# Patient Record
Sex: Female | Born: 1975 | Race: Black or African American | Hispanic: No | Marital: Married | Smoking: Never smoker
Health system: Southern US, Community
[De-identification: ages and names within clinical notes are randomized; demographics above are authoritative.]

## PROBLEM LIST (undated history)

## (undated) DIAGNOSIS — B977 Papillomavirus as the cause of diseases classified elsewhere: Secondary | ICD-10-CM

## (undated) DIAGNOSIS — Z9889 Other specified postprocedural states: Secondary | ICD-10-CM

## (undated) DIAGNOSIS — B009 Herpesviral infection, unspecified: Secondary | ICD-10-CM

## (undated) DIAGNOSIS — R112 Nausea with vomiting, unspecified: Secondary | ICD-10-CM

---

## 2001-04-05 HISTORY — PX: ANKLE SURGERY: SHX546

## 2002-08-07 ENCOUNTER — Encounter: Admission: RE | Admit: 2002-08-07 | Discharge: 2002-08-07 | Payer: Self-pay | Admitting: Family Medicine

## 2002-08-07 ENCOUNTER — Encounter: Payer: Self-pay | Admitting: Family Medicine

## 2003-12-02 ENCOUNTER — Other Ambulatory Visit: Admission: RE | Admit: 2003-12-02 | Discharge: 2003-12-02 | Payer: Self-pay | Admitting: Obstetrics and Gynecology

## 2004-07-14 ENCOUNTER — Other Ambulatory Visit: Admission: RE | Admit: 2004-07-14 | Discharge: 2004-07-14 | Payer: Self-pay | Admitting: Obstetrics and Gynecology

## 2004-07-15 ENCOUNTER — Other Ambulatory Visit: Admission: RE | Admit: 2004-07-15 | Discharge: 2004-07-15 | Payer: Self-pay | Admitting: Obstetrics and Gynecology

## 2005-01-20 ENCOUNTER — Inpatient Hospital Stay (HOSPITAL_COMMUNITY): Admission: AD | Admit: 2005-01-20 | Discharge: 2005-01-23 | Payer: Self-pay | Admitting: Obstetrics and Gynecology

## 2005-09-07 ENCOUNTER — Other Ambulatory Visit: Admission: RE | Admit: 2005-09-07 | Discharge: 2005-09-07 | Payer: Self-pay | Admitting: Obstetrics and Gynecology

## 2006-09-12 ENCOUNTER — Other Ambulatory Visit: Admission: RE | Admit: 2006-09-12 | Discharge: 2006-09-12 | Payer: Self-pay | Admitting: Obstetrics and Gynecology

## 2007-10-03 ENCOUNTER — Other Ambulatory Visit: Admission: RE | Admit: 2007-10-03 | Discharge: 2007-10-03 | Payer: Self-pay | Admitting: Obstetrics and Gynecology

## 2008-11-19 ENCOUNTER — Other Ambulatory Visit: Admission: RE | Admit: 2008-11-19 | Discharge: 2008-11-19 | Payer: Self-pay | Admitting: Obstetrics and Gynecology

## 2009-12-02 ENCOUNTER — Other Ambulatory Visit: Admission: RE | Admit: 2009-12-02 | Discharge: 2009-12-02 | Payer: Self-pay | Admitting: Obstetrics and Gynecology

## 2010-03-13 LAB — GC/CHLAMYDIA PROBE AMP, GENITAL
Chlamydia: NEGATIVE
Chlamydia: NEGATIVE
Gonorrhea: NEGATIVE
Gonorrhea: NEGATIVE

## 2010-03-27 LAB — TYPE AND SCREEN: Antibody Screen: NEGATIVE

## 2010-03-27 LAB — RPR: RPR: NONREACTIVE

## 2010-03-27 LAB — GC/CHLAMYDIA PROBE AMP, GENITAL: Chlamydia: NEGATIVE

## 2010-04-05 NOTE — L&D Delivery Note (Signed)
Delivery Note At 12:15 PM a viable female was delivered via Vaginal, Spontaneous Delivery (Presentation: Left Occiput Anterior).  APGAR: 7, 9; weight 8 lb 5 oz (3771 g).   Placenta status: Intact, Spontaneous.  Cord: 3 vessels with the following complications: Short.    Anesthesia: Epidural  Episiotomy: None Lacerations: 2nd degree Suture Repair: 3.0 vicryl Est. Blood Loss (mL): 400  Mom to postpartum.  Baby to nursery-stable.  [name] J. 10/29/2010, 2:09 PM

## 2010-08-21 NOTE — Op Note (Signed)
[name], [name]               ACCOUNT NO.:  [id number]   MEDICAL RECORD NO.:  [id number]          PATIENT TYPE:  INP   LOCATION:  9111                          FACILITY:  [facility]   PHYSICIAN:  Charles A. Delcambre, MDDATE OF BIRTH:  [birth year]   DATE OF PROCEDURE:  01/22/2005  DATE OF DISCHARGE:                                 OPERATIVE REPORT   DELIVERY NOTE:  This patient was admitted on January 20, 2005, to undergo  induction secondary to husband being off this week and being 39 weeks.  He  is a Clinical cytogeneticist and being off from his team and able to be  in town for delivery, and she is admitted for delivery at 39 weeks.  She  underwent Cervidil induction and was changed to Cytotec induction, changed  after one Cytotec tablet 25 mcg to Pitocin and had spontaneous rupture of  membranes at 4-5 cm, progressed on with Pitocin low-dose regimen to complete  cervical dilation.  She became complete at 0140, had spontaneous vaginal  delivery at 0247.  Placenta followed at 0254.  She had a vigorous female,  Apgars 8 and 9, spontaneous vaginal delivery.  A second degree midline  laceration was repaired, repaired with local anesthetic and 2-0 Vicryl in  standard fashion.  The placenta was intact and was handed off to a tech to  undergo cord blood registry collection, and cord was cut by the father of  the baby.  Estimated blood loss was 400 mL.   COMPLICATIONS:  HSV.  No outbreak or evidence of lesion at the time of labor  and delivery.  Patient on Valtrex suppression.  Positive group B strep,  penicillin suppression given.   PROCEDURES:  1.  Cervidil, Cytotec and Pitocin induction.  2.  Spontaneous vaginal delivery.  3.  Manual rotation from occiput anterior to occiput posterior.  4.  Spontaneous vaginal delivery with repair of laceration.      Charles A. Sydnee Cabal, MD  Electronically Signed     CAD/MEDQ  D:  01/22/2005  T:  01/22/2005  Job:  [id number]

## 2010-08-21 NOTE — H&P (Signed)
NAMEHANSINI, [name]               ACCOUNT NO.:  [id number]   MEDICAL RECORD NO.:  [id number]           PATIENT TYPE:   LOCATION:                                 FACILITY:   PHYSICIAN:  Charles A. Delcambre, MD    DATE OF BIRTH:   DATE OF ADMISSION:  DATE OF DISCHARGE:                                HISTORY & PHYSICAL   HISTORY OF PRESENT ILLNESS:  This patient is to be admitted January 20, 2005  for social induction with reasoning that her husband is a Marketing executive and will be off this week. She will be 39 weeks 0 days on  October 18 and to be induced at 39 weeks 1 day while he is in town for  several days. Due date is January 27, 2005. 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 Otherwise, Pap smear was  ASCUS, negative high-risk HPV. Blood type O positive. Rubella immune. Group  B strep positive on October 2. She has only an allergy to ASPIRIN so we will  plan penicillin prophylaxis once she approaches active labor.   PAST MEDICAL HISTORY:  HSV.   SURGICAL HISTORY:  Ankle surgery.   MEDICATIONS:  Prenatal vitamins, Valtrex 500 mg a day.   ALLERGIES:  ASPIRIN, reaction not specified.   SOCIAL HISTORY:  No tobacco, ethanol, or drug use, or STD exposure other  than HSV. The patient is married in a monogamous relationship with her  partner.   FAMILY HISTORY:  Father mid 54s, hypertension. Mother mid 35s, good health.  Brother 30s, good health. Otherwise, no major medical issues.   REVIEW OF SYSTEMS:  No headaches, blurred vision, scotomata, right upper  quadrant pain, chest pain, fever, chills, nausea, vomiting, diarrhea,  constipation, rupture of membranes, or bleeding. She notes active fetal  movement.   PHYSICAL EXAMINATION:  GENERAL:  Alert and oriented x3.  VITAL SIGNS:  Blood pressure 120/70, weight 175.5 pounds, respirations 18,  pulse 90.  HEENT:  Grossly within normal limits.  NECK:  Supple without  thyromegaly or adenopathy.  LUNGS:  Clear bilaterally.  HEART:  Regular rate and rhythm without rub or gallop, a 2/6 systolic  ejection murmur left sternal border.  BREASTS:  No masses, tenderness, discharge, skin or nipple changes  bilaterally.  ABDOMEN:  Soft, flat, nontender. Abdomen gravid. Fundal height 37 cm.  Estimated fetal weight 3600 g.  PELVIC:  Cervix closed, posterior at 37 weeks when examination undertaken.  EXTREMITIES:  Nontender, minimal edema.   ASSESSMENT:  1.  Intrauterine pregnancy at 39 weeks 1 day. Plan induction, social,      secondary to husband being in town from professional football      employment.  2.  Herpes simplex virus. No lesions, no outbreaks by history recently and      during the pregnancy, and on Valtrex suppression.  3.  Positive group B streptococcus.   PLAN:  1.  Cervidil induction, high-dose Pitocin once favorable. Penicillin      prophylaxis. Questions were answered and will proceed  as outlined. The      patient understands the possibility of a serial induction.      Charles A. Sydnee Cabal, MD  Electronically Signed     CAD/MEDQ  D:  01/13/2005  T:  01/13/2005  Job:  [id number]

## 2010-10-27 ENCOUNTER — Encounter (HOSPITAL_COMMUNITY): Payer: Self-pay

## 2010-10-27 ENCOUNTER — Inpatient Hospital Stay (HOSPITAL_COMMUNITY)
Admission: RE | Admit: 2010-10-27 | Discharge: 2010-10-29 | DRG: 775 | Disposition: A | Discharge status: Home / Self Care | Payer: PRIVATE HEALTH INSURANCE | Source: Ambulatory Visit | Attending: Obstetrics and Gynecology | Admitting: Obstetrics and Gynecology

## 2010-10-27 DIAGNOSIS — Z3480 Encounter for supervision of other normal pregnancy, unspecified trimester: Secondary | ICD-10-CM

## 2010-10-27 DIAGNOSIS — O480 Post-term pregnancy: Principal | ICD-10-CM | POA: Diagnosis present

## 2010-10-27 HISTORY — DX: Herpesviral infection, unspecified: B00.9

## 2010-10-27 HISTORY — DX: Other specified postprocedural states: Z98.890

## 2010-10-27 HISTORY — DX: Papillomavirus as the cause of diseases classified elsewhere: B97.7

## 2010-10-27 HISTORY — DX: Nausea with vomiting, unspecified: R11.2

## 2010-10-27 LAB — CBC
Hemoglobin: 12.6 g/dL (ref 12.0–15.0)
MCHC: 33.5 g/dL (ref 30.0–36.0)
RDW: 14.1 % (ref 11.5–15.5)

## 2010-10-27 MED ORDER — OXYTOCIN 20 UNITS IN LACTATED RINGERS INFUSION - SIMPLE
125.0000 mL/h | Freq: Once | INTRAVENOUS | Status: AC
  Administered 2010-10-28: 999 mL/h via INTRAVENOUS
  Filled 2010-10-27: qty 1000

## 2010-10-27 MED ORDER — LACTATED RINGERS IV SOLN
INTRAVENOUS | Status: DC
  Administered 2010-10-27 – 2010-10-28 (×4): via INTRAVENOUS

## 2010-10-27 MED ORDER — FLEET ENEMA 7-19 GM/118ML RE ENEM: 1.0000 | ENEMA | RECTAL | Status: DC | PRN

## 2010-10-27 MED ORDER — LACTATED RINGERS IV SOLN
500.0000 mL | INTRAVENOUS | Status: DC | PRN
  Administered 2010-10-28: 200 mL via INTRAVENOUS

## 2010-10-27 MED ORDER — TERBUTALINE SULFATE 1 MG/ML IJ SOLN: 0.2500 mg | Freq: Once | INTRAMUSCULAR | Status: AC | PRN

## 2010-10-27 MED ORDER — CITRIC ACID-SODIUM CITRATE 334-500 MG/5ML PO SOLN: 30.0000 mL | ORAL | Status: DC | PRN

## 2010-10-27 MED ORDER — NALBUPHINE SYRINGE 5 MG/0.5 ML
5.0000 mg | INJECTION | INTRAMUSCULAR | Status: DC | PRN
  Administered 2010-10-28: 5 mg via INTRAVENOUS
  Filled 2010-10-27 (×3): qty 0.5

## 2010-10-27 MED ORDER — DINOPROSTONE 10 MG VA INST
10.0000 mg | VAGINAL_INSERT | Freq: Once | VAGINAL | Status: AC
  Administered 2010-10-27: 10 mg via VAGINAL
  Filled 2010-10-27: qty 1

## 2010-10-27 MED ORDER — ACETAMINOPHEN 325 MG PO TABS: 650.0000 mg | ORAL_TABLET | ORAL | Status: DC | PRN

## 2010-10-27 MED ORDER — IBUPROFEN 600 MG PO TABS
600.0000 mg | ORAL_TABLET | Freq: Four times a day (QID) | ORAL | Status: DC | PRN
  Filled 2010-10-27: qty 1

## 2010-10-27 MED ORDER — ONDANSETRON HCL 4 MG/2ML IJ SOLN
4.0000 mg | Freq: Four times a day (QID) | INTRAMUSCULAR | Status: DC | PRN
  Administered 2010-10-28: 4 mg via INTRAVENOUS
  Filled 2010-10-27: qty 2

## 2010-10-27 MED ORDER — OXYCODONE-ACETAMINOPHEN 5-325 MG PO TABS: 2.0000 | ORAL_TABLET | ORAL | Status: DC | PRN

## 2010-10-27 MED ORDER — LIDOCAINE HCL (PF) 1 % IJ SOLN
30.0000 mL | INTRAMUSCULAR | Status: DC | PRN
  Filled 2010-10-27 (×2): qty 30

## 2010-10-27 MED ORDER — ZOLPIDEM TARTRATE 10 MG PO TABS: 10.0000 mg | ORAL_TABLET | Freq: Every evening | ORAL | Status: DC | PRN

## 2010-10-27 NOTE — Plan of Care (Signed)
Problem: Consults Goal: Birthing Suites Patient Information Press F2 to bring up selections list Outcome: Completed/Met Date Met:  10/27/10  Pt > [redacted] weeks EGA

## 2010-10-28 ENCOUNTER — Encounter (HOSPITAL_COMMUNITY): Payer: Self-pay

## 2010-10-28 ENCOUNTER — Encounter (HOSPITAL_COMMUNITY): Payer: Self-pay | Admitting: Anesthesiology

## 2010-10-28 ENCOUNTER — Ambulatory Visit (HOSPITAL_COMMUNITY): Payer: PRIVATE HEALTH INSURANCE | Admitting: Anesthesiology

## 2010-10-28 DIAGNOSIS — Z3480 Encounter for supervision of other normal pregnancy, unspecified trimester: Secondary | ICD-10-CM

## 2010-10-28 LAB — RPR: RPR Ser Ql: NONREACTIVE

## 2010-10-28 MED ORDER — SIMETHICONE 80 MG PO CHEW: 80.0000 mg | CHEWABLE_TABLET | ORAL | Status: DC | PRN

## 2010-10-28 MED ORDER — LANOLIN HYDROUS EX OINT: TOPICAL_OINTMENT | CUTANEOUS | Status: DC | PRN

## 2010-10-28 MED ORDER — FENTANYL 2.5 MCG/ML BUPIVACAINE 1/10 % EPIDURAL INFUSION (WH - ANES)
14.0000 mL/h | INTRAMUSCULAR | Status: DC
  Administered 2010-10-28 (×2): 14 mL/h via EPIDURAL
  Filled 2010-10-28 (×2): qty 60

## 2010-10-28 MED ORDER — DIPHENHYDRAMINE HCL 25 MG PO CAPS: 25.0000 mg | ORAL_CAPSULE | Freq: Four times a day (QID) | ORAL | Status: DC | PRN

## 2010-10-28 MED ORDER — LACTATED RINGERS IV SOLN
500.0000 mL | Freq: Once | INTRAVENOUS | Status: AC
  Administered 2010-10-28: 1000 mL via INTRAVENOUS

## 2010-10-28 MED ORDER — ZOLPIDEM TARTRATE 5 MG PO TABS: 5.0000 mg | ORAL_TABLET | Freq: Every evening | ORAL | Status: DC | PRN

## 2010-10-28 MED ORDER — PRENATAL PLUS 27-1 MG PO TABS
1.0000 | ORAL_TABLET | Freq: Every day | ORAL | Status: DC
  Administered 2010-10-29: 1 via ORAL
  Filled 2010-10-28: qty 1

## 2010-10-28 MED ORDER — OXYCODONE-ACETAMINOPHEN 5-325 MG PO TABS: 1.0000 | ORAL_TABLET | ORAL | Status: DC | PRN

## 2010-10-28 MED ORDER — BENZOCAINE-MENTHOL 20-0.5 % EX AERO: 1.0000 "application " | INHALATION_SPRAY | CUTANEOUS | Status: DC | PRN

## 2010-10-28 MED ORDER — TETANUS-DIPHTH-ACELL PERTUSSIS 5-2.5-18.5 LF-MCG/0.5 IM SUSP
0.5000 mL | Freq: Once | INTRAMUSCULAR | Status: AC
  Administered 2010-10-29: 0.5 mL via INTRAMUSCULAR

## 2010-10-28 MED ORDER — ONDANSETRON HCL 4 MG PO TABS: 4.0000 mg | ORAL_TABLET | ORAL | Status: DC | PRN

## 2010-10-28 MED ORDER — DIPHENHYDRAMINE HCL 50 MG/ML IJ SOLN: 12.5000 mg | INTRAMUSCULAR | Status: DC | PRN

## 2010-10-28 MED ORDER — IBUPROFEN 600 MG PO TABS
600.0000 mg | ORAL_TABLET | Freq: Four times a day (QID) | ORAL | Status: DC
  Administered 2010-10-28 – 2010-10-29 (×3): 600 mg via ORAL
  Filled 2010-10-28 (×3): qty 1

## 2010-10-28 MED ORDER — PHENYLEPHRINE 40 MCG/ML (10ML) SYRINGE FOR IV PUSH (FOR BLOOD PRESSURE SUPPORT)
80.0000 ug | PREFILLED_SYRINGE | INTRAVENOUS | Status: DC | PRN
  Filled 2010-10-28: qty 5

## 2010-10-28 MED ORDER — SENNOSIDES-DOCUSATE SODIUM 8.6-50 MG PO TABS
1.0000 | ORAL_TABLET | Freq: Every day | ORAL | Status: DC
  Administered 2010-10-28: 1 via ORAL

## 2010-10-28 MED ORDER — BENZOCAINE-MENTHOL 20-0.5 % EX AERO
INHALATION_SPRAY | CUTANEOUS | Status: AC
  Filled 2010-10-28: qty 56

## 2010-10-28 MED ORDER — PHENYLEPHRINE 40 MCG/ML (10ML) SYRINGE FOR IV PUSH (FOR BLOOD PRESSURE SUPPORT)
80.0000 ug | PREFILLED_SYRINGE | INTRAVENOUS | Status: DC | PRN
  Filled 2010-10-28 (×2): qty 5

## 2010-10-28 MED ORDER — EPHEDRINE 5 MG/ML INJ
10.0000 mg | INTRAVENOUS | Status: DC | PRN
  Filled 2010-10-28: qty 4

## 2010-10-28 MED ORDER — EPHEDRINE 5 MG/ML INJ
10.0000 mg | INTRAVENOUS | Status: DC | PRN
  Filled 2010-10-28 (×2): qty 4

## 2010-10-28 MED ORDER — ONDANSETRON HCL 4 MG/2ML IJ SOLN: 4.0000 mg | INTRAMUSCULAR | Status: DC | PRN

## 2010-10-28 MED ORDER — WITCH HAZEL-GLYCERIN EX PADS: MEDICATED_PAD | CUTANEOUS | Status: DC | PRN

## 2010-10-28 MED ORDER — PRENATAL PLUS 27-1 MG PO TABS: 1.0000 | ORAL_TABLET | Freq: Every day | ORAL | Status: DC

## 2010-10-28 NOTE — Anesthesia Preprocedure Evaluation (Addendum)
Anesthesia Evaluation  Name, MR# and DOB Patient awake  General Assessment Comment  Reviewed: Allergy & Precautions, H&P  and Patient's Chart, lab work & pertinent test results  Airway Mallampati: II TM Distance: >3 FB Neck ROM: full    Dental  (+) Teeth Intact   Pulmonary  clear to auscultation    Cardiovascular regular Normal   Neuro/Psych  GI/Hepatic/Renal   Endo/Other   Abdominal   Musculoskeletal  Hematology   Peds  Reproductive/Obstetrics (+) Pregnancy   Anesthesia Other Findings                 Anesthesia Physical Anesthesia Plan  ASA: II  Anesthesia Plan: Epidural   Post-op Pain Management:    Induction:   Airway Management Planned:   Additional Equipment:   Intra-op Plan:   Post-operative Plan:   Informed Consent: I have reviewed the patients History and Physical, chart, labs and discussed the procedure including the risks, benefits and alternatives for the proposed anesthesia with the patient or authorized representative who has indicated his/her understanding and acceptance.   Dental Advisory Given  Plan Discussed with: CRNA and Surgeon  Anesthesia Plan Comments: (Labs checked- platelets confirmed with RN in room. Fetal heart tracing, per RN, reportedly stable enough for sitting procedure. Discussed epidural, and patient consents to the procedure:  included risk of possible headache,backache, failed block, allergic reaction, and nerve injury. This patient was asked if she had any questions or concerns before the procedure started. )        Anesthesia Quick Evaluation

## 2010-10-28 NOTE — Progress Notes (Signed)
UR chart review completed.  

## 2010-10-28 NOTE — Anesthesia Procedure Notes (Addendum)
Epidural Patient location during procedure: OB Start time: 10/28/2010 8:02 AM  Staffing Anesthesiologist: Jiles Garter  Preanesthetic Checklist Completed: patient identified, site marked, surgical consent, pre-op evaluation, timeout performed, IV checked, risks and benefits discussed and monitors and equipment checked  Epidural Patient position: sitting Prep: site prepped and draped and DuraPrep Patient monitoring: continuous pulse ox and blood pressure Approach: midline Injection technique: LOR air  Needle:  Needle type: Tuohy  Needle gauge: 17 G Needle length: 9 cm Needle insertion depth: 5 cm cm Catheter type: closed end flexible Catheter size: 19 Gauge Catheter at skin depth: 10 cm Test dose: negative  Assessment Events: blood not aspirated, injection not painful, no injection resistance, negative IV test and no paresthesia  Additional Notes Dosing of Epidural: 1st dose, Through needle...... 5mg  Marcaine 2nd dose, through catheter.... epi 1:200K + Xylocaine 40 mg 3rd dose, through catheter...Marland KitchenMarland Kitchenepi 1:200K + Xylocaine 60 mg Each dose occurred after waiting 3 min,patient was free of IV sx; and patient exhibits no evidence of SA injection  Patient is more comfortable after epidural dosed. Please see RN's note for documentation of vital signs,and FHR which are stable.

## 2010-10-28 NOTE — Progress Notes (Signed)
Pt comfortable with epidural AF VSS cx 8/90/ 0]  Arom clear fluid Doing well anticipate svd

## 2010-10-28 NOTE — H&P (Signed)
Roshaunda Starkey is a 35 y.o. female presenting for induction. She is 40 wks and 5 days based on 5 wks u/s with EDD 10/23/2010. Pregnancy complicated by h/o HSV. Pt is gbs negative. + fm + ctx no lof no vaginal bleeding .  POB hx.. SVD x 1 PGYN hx .. hsv   Med PNV Allergies Aspirin  OB History    Grav Para Term Preterm Abortions TAB SAB Ect Mult Living   2 1 1       1      Past Medical History  Diagnosis Date  . PONV (postoperative nausea and vomiting)   . HPV (human papilloma virus) infection   . Herpes    Past Surgical History  Procedure Date  . Ankle surgery 2003    had 3 total ankle surgeries; 2003 is the most recent    Family History: family history includes Diabetes in her maternal grandmother; Heart disease in her maternal grandmother; Hyperlipidemia in her mother; and Hypertension in her father. Social History:  reports that she has never smoked. She does not have any smokeless tobacco history on file. She reports that she does not drink alcohol or use illicit drugs.  ROS negative except as stated in HPI   Dilation: 8 (megan, rn, requested this rn to check behind her to confirm ) Effacement (%): 90 Station: 0 Exam by:: Enis Slipper, rn Blood pressure 130/80, pulse 88, temperature 97.6 F (36.4 C), temperature source Oral, resp. rate 20, height 5\' 9"  (1.753 m), weight 78.472 kg (173 lb).  CV RRR Lungs Clear  Abd Gravid nontender  Ext 1+ edema    Prenatal labs: ABO, Rh:  O positive  Antibody: Negative (12/23 0000) Rubella:   RPR: NON REACTIVE (07/24 2103)  HBsAg: Negative (12/23 0000)  HIV: Non-reactive (12/23 0000)  GBS:   Negative   Assessment/Plan: 40 wks 5 days post dates admitted for induction received cervidil over night    Dayn Barich J. 10/28/2010, 8:10 AM

## 2010-10-29 ENCOUNTER — Encounter (HOSPITAL_COMMUNITY)
Admit: 2010-10-29 | Discharge: 2010-10-29 | Disposition: A | Discharge status: Home / Self Care | Payer: PRIVATE HEALTH INSURANCE | Attending: Obstetrics and Gynecology | Admitting: Obstetrics and Gynecology

## 2010-10-29 DIAGNOSIS — O923 Agalactia: Secondary | ICD-10-CM | POA: Insufficient documentation

## 2010-10-29 LAB — CBC
Platelets: 156 10*3/uL (ref 150–400)
RBC: 3.6 MIL/uL — ABNORMAL LOW (ref 3.87–5.11)
RDW: 14.3 % (ref 11.5–15.5)
WBC: 12.8 10*3/uL — ABNORMAL HIGH (ref 4.0–10.5)

## 2010-10-29 LAB — ABO/RH: ABO/RH(D): O POS

## 2010-10-29 MED ORDER — IBUPROFEN 600 MG PO TABS: 600.0000 mg | ORAL_TABLET | Freq: Four times a day (QID) | ORAL | Status: AC

## 2010-10-29 NOTE — Anesthesia Postprocedure Evaluation (Signed)
  Anesthesia Post-op Note  Patient: [name]  This patient has recovered from her labor epidural, and I am not aware of any complications or problems.

## 2010-10-29 NOTE — Progress Notes (Signed)
Mom plans to pump and bottle feed. She is only obtaining very small amounts of Colostrum and is supplementing with formula. Questions about pump rental answered. Has not made up her mind about rental vs purchase. Rental paperwork given to mom and to go to [facility] office to complete pump rental. Offered assist with breast feeding if mom desires. Enc to page if wants assist. No questions at present.

## 2010-10-29 NOTE — Discharge Summary (Signed)
Obstetric Discharge Summary Reason for Admission: induction of labor Prenatal Procedures: none Intrapartum Procedures: spontaneous vaginal delivery Postpartum Procedures: none Complications-Operative and Postpartum: none  Hemoglobin  Date Value Range Status  10/29/2010 11.1* 12.0-15.0 (g/dL) Final     HCT  Date Value Range Status  10/29/2010 33.8* 36.0-46.0 (%) Final    Discharge Diagnoses: Term Pregnancy-delivered  Discharge Information: Date: 10/29/2010 Activity: pelvic rest Diet: routine Medications: Ibuprophen Condition: stable Instructions: refer to practice specific booklet Discharge to: home   Newborn Data: Live born  Information for the patient's newborn:  Rainah, Kirshner [[id number]]  female ; APGAR , ; weight ;  Home with mother.  Baby Stairs J. 10/29/2010, 2:28 PM

## 2010-10-29 NOTE — Progress Notes (Signed)
Post Partum Day 1 Subjective: no complaints, up ad lib, voiding, tolerating PO, + flatus and desires d/c home today   Objective: Blood pressure 117/73, pulse 67, temperature 97.8 F (36.6 C), temperature source Oral, resp. rate 18, height 5\' 9"  (1.753 m), weight 78.472 kg (173 lb), SpO2 100.00%, unknown if currently breastfeeding.  Physical Exam:  General: alert and cooperative Lochia: appropriate Uterine Fundus: firm Incision: not applicable  DVT Evaluation: No evidence of DVT seen on physical exam.   Basename 10/29/10 0525 10/27/10 2103  HGB 11.1* 12.6  HCT 33.8* 37.6    Assessment/Plan: Discharge home   LOS: 2 days   [name] J. 10/29/2010, 2:23 PM

## 2011-01-25 ENCOUNTER — Other Ambulatory Visit: Payer: Self-pay | Admitting: Obstetrics and Gynecology

## 2011-01-25 ENCOUNTER — Other Ambulatory Visit (HOSPITAL_COMMUNITY)
Admission: RE | Admit: 2011-01-25 | Discharge: 2011-01-25 | Disposition: A | Discharge status: Home / Self Care | Payer: PRIVATE HEALTH INSURANCE | Source: Ambulatory Visit | Attending: Obstetrics and Gynecology | Admitting: Obstetrics and Gynecology

## 2011-01-25 DIAGNOSIS — Z01419 Encounter for gynecological examination (general) (routine) without abnormal findings: Secondary | ICD-10-CM | POA: Insufficient documentation

## 2012-02-17 ENCOUNTER — Other Ambulatory Visit: Payer: Self-pay | Admitting: Obstetrics and Gynecology

## 2012-02-17 ENCOUNTER — Other Ambulatory Visit (HOSPITAL_COMMUNITY)
Admission: RE | Admit: 2012-02-17 | Discharge: 2012-02-17 | Disposition: A | Discharge status: Home / Self Care | Payer: BC Managed Care – PPO | Source: Ambulatory Visit | Attending: Obstetrics and Gynecology | Admitting: Obstetrics and Gynecology

## 2012-02-17 DIAGNOSIS — Z01419 Encounter for gynecological examination (general) (routine) without abnormal findings: Secondary | ICD-10-CM | POA: Insufficient documentation

## 2012-11-03 LAB — OB RESULTS CONSOLE RPR: RPR: NONREACTIVE

## 2012-11-03 LAB — OB RESULTS CONSOLE RUBELLA ANTIBODY, IGM: RUBELLA: IMMUNE

## 2012-11-03 LAB — OB RESULTS CONSOLE ABO/RH: RH TYPE: POSITIVE

## 2012-11-03 LAB — OB RESULTS CONSOLE HEPATITIS B SURFACE ANTIGEN: Hepatitis B Surface Ag: NEGATIVE

## 2012-11-03 LAB — OB RESULTS CONSOLE HIV ANTIBODY (ROUTINE TESTING): HIV: NONREACTIVE

## 2012-11-03 LAB — OB RESULTS CONSOLE ANTIBODY SCREEN: Antibody Screen: NEGATIVE

## 2012-11-13 LAB — OB RESULTS CONSOLE GC/CHLAMYDIA
Chlamydia: NEGATIVE
Gonorrhea: NEGATIVE

## 2013-04-05 NOTE — L&D Delivery Note (Signed)
Delivery Note At 10:50 AM a viable female was delivered via Vaginal, Spontaneous Delivery Nuchal cord x 1 reduced  (Presentation: Middle Occiput posterior .  APGAR: 8, 9; weight 7 lb 4.8 oz (3311 g).   Placenta status: Intact, Spontaneous.  Cord: 3 vessels with the following complications: None.  Cord pH: NA  Anesthesia: Epidural  Episiotomy: None Lacerations: 2nd degree Suture Repair: 3.0 vicryl Est. Blood Loss (mL): 250  Mom to postpartum.  Baby to Couplet care / Skin to Skin.  Tyrene Nader J. 06/01/2013, 4:43 PM

## 2013-05-14 LAB — OB RESULTS CONSOLE GBS: GBS: NEGATIVE

## 2013-05-31 ENCOUNTER — Inpatient Hospital Stay (HOSPITAL_COMMUNITY)
Admission: AD | Admit: 2013-05-31 | Discharge: 2013-06-03 | DRG: 774 | Disposition: A | Discharge status: Home / Self Care | Payer: BC Managed Care – PPO | Source: Ambulatory Visit | Attending: Obstetrics and Gynecology | Admitting: Obstetrics and Gynecology

## 2013-05-31 DIAGNOSIS — A6000 Herpesviral infection of urogenital system, unspecified: Secondary | ICD-10-CM | POA: Diagnosis present

## 2013-05-31 DIAGNOSIS — O09529 Supervision of elderly multigravida, unspecified trimester: Secondary | ICD-10-CM | POA: Diagnosis present

## 2013-05-31 DIAGNOSIS — O98519 Other viral diseases complicating pregnancy, unspecified trimester: Secondary | ICD-10-CM | POA: Diagnosis present

## 2013-05-31 DIAGNOSIS — IMO0001 Reserved for inherently not codable concepts without codable children: Secondary | ICD-10-CM

## 2013-06-01 ENCOUNTER — Inpatient Hospital Stay (HOSPITAL_COMMUNITY): Payer: BC Managed Care – PPO | Admitting: Anesthesiology

## 2013-06-01 ENCOUNTER — Encounter (HOSPITAL_COMMUNITY): Payer: BC Managed Care – PPO | Admitting: Anesthesiology

## 2013-06-01 ENCOUNTER — Inpatient Hospital Stay (HOSPITAL_COMMUNITY): Payer: BC Managed Care – PPO

## 2013-06-01 ENCOUNTER — Encounter (HOSPITAL_COMMUNITY): Payer: Self-pay | Admitting: *Deleted

## 2013-06-01 DIAGNOSIS — IMO0001 Reserved for inherently not codable concepts without codable children: Secondary | ICD-10-CM

## 2013-06-01 LAB — CBC
HCT: 37 % (ref 36.0–46.0)
HEMOGLOBIN: 12.4 g/dL (ref 12.0–15.0)
MCH: 30.2 pg (ref 26.0–34.0)
MCHC: 33.5 g/dL (ref 30.0–36.0)
MCV: 90.2 fL (ref 78.0–100.0)
Platelets: 182 10*3/uL (ref 150–400)
RBC: 4.1 MIL/uL (ref 3.87–5.11)
RDW: 14.3 % (ref 11.5–15.5)
WBC: 9.3 10*3/uL (ref 4.0–10.5)

## 2013-06-01 LAB — TYPE AND SCREEN
ABO/RH(D): O POS
ANTIBODY SCREEN: NEGATIVE

## 2013-06-01 LAB — RPR: RPR: NONREACTIVE

## 2013-06-01 MED ORDER — BENZOCAINE-MENTHOL 20-0.5 % EX AERO
1.0000 "application " | INHALATION_SPRAY | CUTANEOUS | Status: DC | PRN
  Administered 2013-06-01: 1 via TOPICAL
  Filled 2013-06-01: qty 56

## 2013-06-01 MED ORDER — FENTANYL 2.5 MCG/ML BUPIVACAINE 1/10 % EPIDURAL INFUSION (WH - ANES)
14.0000 mL/h | INTRAMUSCULAR | Status: DC | PRN
  Administered 2013-06-01: 14 mL/h via EPIDURAL
  Filled 2013-06-01: qty 125

## 2013-06-01 MED ORDER — TERBUTALINE SULFATE 1 MG/ML IJ SOLN: 0.2500 mg | Freq: Once | INTRAMUSCULAR | Status: DC | PRN

## 2013-06-01 MED ORDER — SENNOSIDES-DOCUSATE SODIUM 8.6-50 MG PO TABS
2.0000 | ORAL_TABLET | ORAL | Status: DC
  Administered 2013-06-01 – 2013-06-02 (×2): 2 via ORAL
  Filled 2013-06-01 (×2): qty 2

## 2013-06-01 MED ORDER — OXYTOCIN 40 UNITS IN LACTATED RINGERS INFUSION - SIMPLE MED
1.0000 m[IU]/min | INTRAVENOUS | Status: DC
  Administered 2013-06-01: 1.333 m[IU]/min via INTRAVENOUS

## 2013-06-01 MED ORDER — ONDANSETRON HCL 4 MG/2ML IJ SOLN: 4.0000 mg | INTRAMUSCULAR | Status: DC | PRN

## 2013-06-01 MED ORDER — WITCH HAZEL-GLYCERIN EX PADS: 1.0000 "application " | MEDICATED_PAD | CUTANEOUS | Status: DC | PRN

## 2013-06-01 MED ORDER — LANOLIN HYDROUS EX OINT: TOPICAL_OINTMENT | CUTANEOUS | Status: DC | PRN

## 2013-06-01 MED ORDER — LACTATED RINGERS IV SOLN
INTRAVENOUS | Status: DC
  Administered 2013-06-01: 05:00:00 via INTRAVENOUS

## 2013-06-01 MED ORDER — OXYCODONE-ACETAMINOPHEN 5-325 MG PO TABS: 1.0000 | ORAL_TABLET | ORAL | Status: DC | PRN

## 2013-06-01 MED ORDER — SIMETHICONE 80 MG PO CHEW: 80.0000 mg | CHEWABLE_TABLET | ORAL | Status: DC | PRN

## 2013-06-01 MED ORDER — ZOLPIDEM TARTRATE 5 MG PO TABS: 5.0000 mg | ORAL_TABLET | Freq: Every evening | ORAL | Status: DC | PRN

## 2013-06-01 MED ORDER — PRENATAL MULTIVITAMIN CH
1.0000 | ORAL_TABLET | Freq: Every day | ORAL | Status: DC
  Administered 2013-06-01 – 2013-06-02 (×2): 1 via ORAL
  Filled 2013-06-01 (×2): qty 1

## 2013-06-01 MED ORDER — LIDOCAINE HCL (PF) 1 % IJ SOLN
INTRAMUSCULAR | Status: DC | PRN
Start: 2013-06-01 — End: 2013-06-01
  Administered 2013-06-01 (×2): 5 mL

## 2013-06-01 MED ORDER — IBUPROFEN 600 MG PO TABS
600.0000 mg | ORAL_TABLET | Freq: Four times a day (QID) | ORAL | Status: DC
  Administered 2013-06-01 – 2013-06-03 (×7): 600 mg via ORAL
  Filled 2013-06-01 (×7): qty 1

## 2013-06-01 MED ORDER — ONDANSETRON HCL 4 MG PO TABS: 4.0000 mg | ORAL_TABLET | ORAL | Status: DC | PRN

## 2013-06-01 MED ORDER — ACETAMINOPHEN 325 MG PO TABS: 650.0000 mg | ORAL_TABLET | ORAL | Status: DC | PRN

## 2013-06-01 MED ORDER — METHYLERGONOVINE MALEATE 0.2 MG/ML IJ SOLN: 0.2000 mg | INTRAMUSCULAR | Status: DC | PRN

## 2013-06-01 MED ORDER — ONDANSETRON HCL 4 MG/2ML IJ SOLN: 4.0000 mg | Freq: Four times a day (QID) | INTRAMUSCULAR | Status: DC | PRN

## 2013-06-01 MED ORDER — IBUPROFEN 600 MG PO TABS: 600.0000 mg | ORAL_TABLET | Freq: Four times a day (QID) | ORAL | Status: DC | PRN

## 2013-06-01 MED ORDER — OXYTOCIN 40 UNITS IN LACTATED RINGERS INFUSION - SIMPLE MED
62.5000 mL/h | INTRAVENOUS | Status: DC
  Filled 2013-06-01: qty 1000

## 2013-06-01 MED ORDER — METHYLERGONOVINE MALEATE 0.2 MG PO TABS: 0.2000 mg | ORAL_TABLET | ORAL | Status: DC | PRN

## 2013-06-01 MED ORDER — PHENYLEPHRINE 40 MCG/ML (10ML) SYRINGE FOR IV PUSH (FOR BLOOD PRESSURE SUPPORT)
80.0000 ug | PREFILLED_SYRINGE | INTRAVENOUS | Status: DC | PRN
  Filled 2013-06-01: qty 10
  Filled 2013-06-01: qty 2

## 2013-06-01 MED ORDER — EPHEDRINE 5 MG/ML INJ
10.0000 mg | INTRAVENOUS | Status: DC | PRN
  Filled 2013-06-01: qty 4
  Filled 2013-06-01: qty 2

## 2013-06-01 MED ORDER — FLEET ENEMA 7-19 GM/118ML RE ENEM: 1.0000 | ENEMA | RECTAL | Status: DC | PRN

## 2013-06-01 MED ORDER — OXYTOCIN BOLUS FROM INFUSION
500.0000 mL | INTRAVENOUS | Status: DC
  Administered 2013-06-01: 500 mL via INTRAVENOUS

## 2013-06-01 MED ORDER — CITRIC ACID-SODIUM CITRATE 334-500 MG/5ML PO SOLN: 30.0000 mL | ORAL | Status: DC | PRN

## 2013-06-01 MED ORDER — EPHEDRINE 5 MG/ML INJ
10.0000 mg | INTRAVENOUS | Status: DC | PRN
  Filled 2013-06-01: qty 2

## 2013-06-01 MED ORDER — LACTATED RINGERS IV SOLN
500.0000 mL | Freq: Once | INTRAVENOUS | Status: AC
  Administered 2013-06-01: 500 mL via INTRAVENOUS

## 2013-06-01 MED ORDER — DIPHENHYDRAMINE HCL 25 MG PO CAPS: 25.0000 mg | ORAL_CAPSULE | Freq: Four times a day (QID) | ORAL | Status: DC | PRN

## 2013-06-01 MED ORDER — DIBUCAINE 1 % RE OINT: 1.0000 "application " | TOPICAL_OINTMENT | RECTAL | Status: DC | PRN

## 2013-06-01 MED ORDER — DIPHENHYDRAMINE HCL 50 MG/ML IJ SOLN: 12.5000 mg | INTRAMUSCULAR | Status: DC | PRN

## 2013-06-01 MED ORDER — PHENYLEPHRINE 40 MCG/ML (10ML) SYRINGE FOR IV PUSH (FOR BLOOD PRESSURE SUPPORT)
80.0000 ug | PREFILLED_SYRINGE | INTRAVENOUS | Status: DC | PRN
  Filled 2013-06-01: qty 2

## 2013-06-01 MED ORDER — LACTATED RINGERS IV SOLN: 500.0000 mL | INTRAVENOUS | Status: DC | PRN

## 2013-06-01 MED ORDER — LIDOCAINE HCL (PF) 1 % IJ SOLN
30.0000 mL | INTRAMUSCULAR | Status: DC | PRN
  Filled 2013-06-01: qty 30

## 2013-06-01 NOTE — Anesthesia Procedure Notes (Signed)
Epidural Patient location during procedure: OB Start time: 06/01/2013 5:21 AM  Staffing Anesthesiologist: Brayton CavesJACKSON, Arian Mcquitty Performed by: anesthesiologist   Preanesthetic Checklist Completed: patient identified, site marked, surgical consent, pre-op evaluation, timeout performed, IV checked, risks and benefits discussed and monitors and equipment checked  Epidural Patient position: sitting Prep: site prepped and draped and DuraPrep Patient monitoring: continuous pulse ox and blood pressure Approach: midline Injection technique: LOR air  Needle:  Needle type: Tuohy  Needle gauge: 17 G Needle length: 9 cm and 9 Needle insertion depth: 5 cm cm Catheter type: closed end flexible Catheter size: 19 Gauge Catheter at skin depth: 10 cm Test dose: negative  Assessment Events: blood not aspirated, injection not painful, no injection resistance, negative IV test and no paresthesia  Additional Notes Patient identified.  Risk benefits discussed including failed block, incomplete pain control, headache, nerve damage, paralysis, blood pressure changes, nausea, vomiting, reactions to medication both toxic or allergic, and postpartum back pain.  Patient expressed understanding and wished to proceed.  All questions were answered.  Sterile technique used throughout procedure and epidural site dressed with sterile barrier dressing. No paresthesia or other complications noted.The patient did not experience any signs of intravascular injection such as tinnitus or metallic taste in mouth nor signs of intrathecal spread such as rapid motor block. Please see nursing notes for vital signs.

## 2013-06-01 NOTE — Anesthesia Postprocedure Evaluation (Signed)
Anesthesia Post Note  Patient: [name]  Procedure(s) Performed: * No procedures listed *  Anesthesia type: Epidural  Patient location: Mother/Baby  Post pain: Pain level controlled  Post assessment: Post-op Vital signs reviewed  Last Vitals:  Filed Vitals:   06/01/13 1430  BP: 121/73  Pulse: 82  Temp: 36.9 C  Resp: 18    Post vital signs: Reviewed  Level of consciousness:alert  Complications: No apparent anesthesia complications

## 2013-06-01 NOTE — H&P (Signed)
Madeline SarasDeanne Mendoza is a 38 y.o. female presenting  At 39 wks and 1 day with regular contractions and active labor. EDD 06/07/2013. 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 Pt has been on valtrex for suppression. She denies any suspicious sores or lesions.  . History OB History   Grav Para Term Preterm Abortions TAB SAB Ect Mult Living   3 3 3       3      Past Medical History  Diagnosis Date  . PONV (postoperative nausea and vomiting)   . HPV (human papilloma virus) infection   . Herpes    Past Surgical History  Procedure Laterality Date  . Ankle surgery  2003    had 3 total ankle surgeries; 2003 is the most recent    Family History: family history includes Diabetes in her maternal grandmother; Heart disease in her maternal grandmother; Hyperlipidemia in her mother; Hypertension in her father. Social History:  reports that she has never smoked. She does not have any smokeless tobacco history on file. She reports that she does not drink alcohol or use illicit drugs.   Prenatal Transfer Tool  Maternal Diabetes: No Genetic Screening: Declined Maternal Ultrasounds/Referrals: Normal Fetal Ultrasounds or other Referrals:  None Maternal Substance Abuse:  No Significant Maternal Medications:  None Significant Maternal Lab Results:  Lab values include: Group B Strep negative Other Comments:  on valtrex for hsv suppression   Review of Systems  All other systems reviewed and are negative.    Dilation: 10 Effacement (%): 100 Station: +2 Exam by:: Dr Richardson Doppole Blood pressure 121/73, pulse 82, temperature 98.4 F (36.9 C), temperature source Axillary, resp. rate 18, height 5\' 10"  (1.778 m), weight 80.287 kg (177 lb), SpO2 98.00%, unknown if currently breastfeeding. Maternal Exam:  Uterine Assessment: Contraction strength is firm.  Contraction frequency is regular.   Abdomen: Patient reports no abdominal tenderness. Fetal presentation: vertex  Introitus: Normal vulva. Normal vagina.    Fetal  Exam Fetal Monitor Review: Mode: fetoscope.   Baseline rate: 130.  Pattern: accelerations present.    Fetal State Assessment: Category I - tracings are normal.     Physical Exam  Constitutional: She is oriented to person, place, and time. She appears well-developed and well-nourished.  Neck: Normal range of motion.  Cardiovascular: Normal rate and regular rhythm.   Respiratory: Effort normal and breath sounds normal.  GI: Soft.  Genitourinary: Vagina normal.  Musculoskeletal: Normal range of motion.  Neurological: She is alert and oriented to person, place, and time.  Skin: Skin is warm and dry.  Psychiatric: She has a normal mood and affect.    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  Anticipate svd.    Antonino Nienhuis J. 06/01/2013, 4:38 PM

## 2013-06-01 NOTE — MAU Note (Signed)
contractions 

## 2013-06-01 NOTE — Anesthesia Preprocedure Evaluation (Signed)
Anesthesia Evaluation  Patient identified by MRN, date of birth, ID band Patient awake    Reviewed: Allergy & Precautions, H&P , Patient's Chart, lab work & pertinent test results  History of Anesthesia Complications (+) PONV and history of anesthetic complications  Airway Mallampati: II TM Distance: >3 FB Neck ROM: full    Dental   Pulmonary  breath sounds clear to auscultation        Cardiovascular Rhythm:regular Rate:Normal     Neuro/Psych    GI/Hepatic   Endo/Other    Renal/GU      Musculoskeletal   Abdominal   Peds  Hematology   Anesthesia Other Findings   Reproductive/Obstetrics (+) Pregnancy                           Anesthesia Physical Anesthesia Plan  ASA: II  Anesthesia Plan: Epidural   Post-op Pain Management:    Induction:   Airway Management Planned:   Additional Equipment:   Intra-op Plan:   Post-operative Plan:   Informed Consent: I have reviewed the patients History and Physical, chart, labs and discussed the procedure including the risks, benefits and alternatives for the proposed anesthesia with the patient or authorized representative who has indicated his/her understanding and acceptance.     Plan Discussed with:   Anesthesia Plan Comments:         Anesthesia Quick Evaluation

## 2013-06-01 NOTE — Lactation Note (Signed)
This note was copied from the chart of Madeline Matthew Saraseanne Hoge. Lactation Consultation Note; Initial visit with this experienced BF mom. Baby now 3 hours old and mom reports that she just fed for 20 minutes. Reports she felt tugging, no pain. Baby now skin to skin with mom. Asking about lanolin- encouraged to rub EBM into nipples after nursing instead. No further questions at present. To call for assist prn. BF brochure given with resources for support after DC.  Patient Name: [name]: 06/01/2013 Reason for consult: Initial assessment   Maternal Data Formula Feeding for Exclusion: Yes Reason for exclusion: Mother's choice to formula and breast feed on admission Infant to breast within first hour of birth: Yes Does the patient have breastfeeding experience prior to this delivery?: Yes  Feeding Feeding Type: Breast Fed Length of feed: 20 min  LATCH Score/Interventions                      Lactation Tools Discussed/Used     Consult Status Consult Status: Follow-up Mendoza: 06/02/13 Follow-up type: In-patient    Pamelia HoitWeeks, Tunisha Ruland D 06/01/2013, 2:04 PM

## 2013-06-01 NOTE — MAU Note (Signed)
PT SAYS SHE STARTED HURTING  BAD AT   1045.   VE 3 WEEKS AGO CLOSED.     HAS HX  - HSV-   LAST OUTBREAK-  NONE DURING PREG-  IS TAKING VALTREX-  DENIES ANY OUTBREAK S/S  NOW.     DENIES MRSA.

## 2013-06-02 LAB — CBC
HEMATOCRIT: 34.3 % — AB (ref 36.0–46.0)
Hemoglobin: 11.4 g/dL — ABNORMAL LOW (ref 12.0–15.0)
MCH: 30.1 pg (ref 26.0–34.0)
MCHC: 33.2 g/dL (ref 30.0–36.0)
MCV: 90.5 fL (ref 78.0–100.0)
Platelets: 164 10*3/uL (ref 150–400)
RBC: 3.79 MIL/uL — ABNORMAL LOW (ref 3.87–5.11)
RDW: 14.5 % (ref 11.5–15.5)
WBC: 12.4 10*3/uL — ABNORMAL HIGH (ref 4.0–10.5)

## 2013-06-02 NOTE — Progress Notes (Signed)
Post Partum Day 1:S/P SVD w/ 2nd degree Subjective: Patient up ad lib, denies syncope or dizziness. Feeding:  Breastfeeding Contraceptive plan:   Unsure  Objective: Blood pressure 114/76, pulse 71, temperature 98.2 F (36.8 C), temperature source Axillary, resp. rate 18, height 5\' 10"  (1.778 m), weight 177 lb (80.287 kg), SpO2 98.00%, unknown if currently breastfeeding.  Physical Exam:  General: alert and no distress Lochia: appropriate Uterine Fundus: firm Incision: healing well DVT Evaluation: No evidence of DVT seen on physical exam. Negative Homan's sign.   Recent Labs  06/01/13 0335 06/02/13 0634  HGB 12.4 11.4*  HCT 37.0 34.3*    Assessment/Plan: S/P Vaginal delivery day 1 Continue current care Plan for discharge tomorrow and Breastfeeding Declines D/C today as infant being evaluated for jaundice Will consider contraceptive options   LOS: 2 days   [name], Sivan Cuello 06/02/2013, 8:36 AM

## 2013-06-03 MED ORDER — IBUPROFEN 600 MG PO TABS: 600.0000 mg | ORAL_TABLET | Freq: Four times a day (QID) | ORAL | Status: AC | PRN

## 2013-06-03 NOTE — Discharge Instructions (Signed)

## 2013-06-03 NOTE — Discharge Summary (Signed)
Vaginal Delivery Discharge Summary  [name]  DOB:    1975/07/30 MRN:    [id number] CSN:    [id number]  Date of admission:                  05/31/13  Date of discharge:                   06/03/13  Procedures this admission:  SVB, Repair of 2nd degree laceration  Date of Delivery: 06/01/13  Newborn Data:  Live born female  Birth Weight: 7 lb 4.8 oz (3311 g) APGAR: 8, 9  Home with mother.  History of Present Illness:  [name] is a 38 y.o. female, G3P3003, who presents at [redacted]w[redacted]d weeks gestation. The patient has been followed at [facility]. She was admitted onset of labor. 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  Hospital course:  The patient was admitted for onset of labor.   Her labor was not complicated. She proceeded to have a vaginal delivery of a healthy infant, attended by Dr. Richardson Dopp, with epidural analgesia. Her delivery was not complicated. Her postpartum course was not complicated.  She was discharged to home on postpartum day 2 doing well.  Feeding:  Breast and bottle  Contraception:  Likely vasectomy  Discharge hemoglobin:  Hemoglobin  Date Value Ref Range Status  06/02/2013 11.4* 12.0 - 15.0 g/dL Final     HCT  Date Value Ref Range Status  06/02/2013 34.3* 36.0 - 46.0 % Final    Discharge Physical Exam:   General: alert Lochia: appropriate Uterine Fundus: firm Incision: healing well DVT Evaluation: No evidence of DVT seen on physical exam. Negative Homan's sign.  Intrapartum Procedures: spontaneous vaginal delivery Postpartum Procedures: none Complications-Operative and Postpartum: 2nd degree perineal laceration  Discharge Diagnoses: Term Pregnancy-delivered  Discharge Information:  Activity:           Per [facility] guidelines Diet:                routine Medications: Ibuprofen Condition:      stable Instructions:   Postpartum Care After Vaginal Delivery  After you deliver your  newborn (postpartum period), the usual stay in the hospital is 24 72 hours. If there were problems with your labor or delivery, or if you have other medical problems, you might be in the hospital longer.  While you are in the hospital, you will receive help and instructions on how to care for yourself and your newborn during the postpartum period.  While you are in the hospital:  Be sure to tell your nurses if you have pain or discomfort, as well as where you feel the pain and what makes the pain worse.  If you had an incision made near your vagina (episiotomy) or if you had some tearing during delivery, the nurses may put ice packs on your episiotomy or tear. The ice packs may help to reduce the pain and swelling.  If you are breastfeeding, you may feel uncomfortable contractions of your uterus for a couple of weeks. This is normal. The contractions help your uterus get back to normal size.  It is normal to have some bleeding after delivery.  For the first 1 3 days after delivery, the flow is red and the amount may be similar to a period.  It is common for the flow to start and stop.  In the first few days, you may pass some small clots. Let  your nurses know if you begin to pass large clots or your flow increases.  Do not  flush blood clots down the toilet before having the nurse look at them.  During the next 3 10 days after delivery, your flow should become more watery and pink or brown-tinged in color.  Ten to fourteen days after delivery, your flow should be a small amount of yellowish-white discharge.  The amount of your flow will decrease over the first few weeks after delivery. Your flow may stop in 6 8 weeks. Most women have had their flow stop by 12 weeks after delivery.  You should change your sanitary pads frequently.  Wash your hands thoroughly with soap and water for at least 20 seconds after changing pads, using the toilet, or before holding or feeding your newborn.  You  should feel like you need to empty your bladder within the first 6 8 hours after delivery.  In case you become weak, lightheaded, or faint, call your nurse before you get out of bed for the first time and before you take a shower for the first time.  Within the first few days after delivery, your breasts may begin to feel tender and full. This is called engorgement. Breast tenderness usually goes away within 48 72 hours after engorgement occurs. You may also notice milk leaking from your breasts. If you are not breastfeeding, do not stimulate your breasts. Breast stimulation can make your breasts produce more milk.  Spending as much time as possible with your newborn is very important. During this time, you and your newborn can feel close and get to know each other. Having your newborn stay in your room (rooming in) will help to strengthen the bond with your newborn. It will give you time to get to know your newborn and become comfortable caring for your newborn.  Your hormones change after delivery. Sometimes the hormone changes can temporarily cause you to feel sad or tearful. These feelings should not last more than a few days. If these feelings last longer than that, you should talk to your caregiver.  If desired, talk to your caregiver about methods of family planning or contraception.  Talk to your caregiver about immunizations. Your caregiver may want you to have the following immunizations before leaving the hospital:  Tetanus, diphtheria, and pertussis (Tdap) or tetanus and diphtheria (Td) immunization. It is very important that you and your family (including grandparents) or others caring for your newborn are up-to-date with the Tdap or Td immunizations. The Tdap or Td immunization can help protect your newborn from getting ill.  Rubella immunization.  Varicella (chickenpox) immunization.  Influenza immunization. You should receive this annual immunization if you did not receive the  immunization during your pregnancy. Document Released: 01/17/2007 Document Revised: 12/15/2011 Document Reviewed: 11/17/2011 [facility] Patient Information 2014 [place].   Postpartum Depression and Baby Blues  The postpartum period begins right after the birth of a baby. During this time, there is often a great amount of joy and excitement. It is also a time of considerable changes in the life of the parent(s). Regardless of how many times a mother gives birth, each child brings new challenges and dynamics to the family. It is not unusual to have feelings of excitement accompanied by confusing shifts in moods, emotions, and thoughts. All mothers are at risk of developing postpartum depression or the "baby blues." These mood changes can occur right after giving birth, or they may occur many months after giving birth.  The baby blues or postpartum depression can be mild or severe. Additionally, postpartum depression can resolve rather quickly, or it can be a long-term condition. CAUSES Elevated hormones and their rapid decline are thought to be a main cause of postpartum depression and the baby blues. There are a number of hormones that radically change during and after pregnancy. Estrogen and progesterone usually decrease immediately after delivering your baby. The level of thyroid hormone and various cortisol steroids also rapidly drop. Other factors that play a major role in these changes include major life events and genetics.  RISK FACTORS If you have any of the following risks for the baby blues or postpartum depression, know what symptoms to watch out for during the postpartum period. Risk factors that may increase the likelihood of getting the baby blues or postpartum depression include:  Havinga personal or family history of depression.  Having depression while being pregnant.  Having premenstrual or oral contraceptive-associated mood issues.  Having exceptional life stress.  Having  marital conflict.  Lacking a social support network.  Having a baby with special needs.  Having health problems such as diabetes. SYMPTOMS Baby blues symptoms include:  Brief fluctuations in mood, such as going from extreme happiness to sadness.  Decreased concentration.  Difficulty sleeping.  Crying spells, tearfulness.  Irritability.  Anxiety. Postpartum depression symptoms typically begin within the first month after giving birth. These symptoms include:  Difficulty sleeping or excessive sleepiness.  Marked weight loss.  Agitation.  Feelings of worthlessness.  Lack of interest in activity or food. Postpartum psychosis is a very concerning condition and can be dangerous. Fortunately, it is rare. Displaying any of the following symptoms is cause for immediate medical attention. Postpartum psychosis symptoms include:  Hallucinations and delusions.  Bizarre or disorganized behavior.  Confusion or disorientation. DIAGNOSIS  A diagnosis is made by an evaluation of your symptoms. There are no medical or lab tests that lead to a diagnosis, but there are various questionnaires that a caregiver may use to identify those with the baby blues, postpartum depression, or psychosis. Often times, a screening tool called the [place] Postnatal Depression Scale is used to diagnose depression in the postpartum period.  TREATMENT The baby blues usually goes away on its own in 1 to 2 weeks. Social support is often all that is needed. You should be encouraged to get adequate sleep and rest. Occasionally, you may be given medicines to help you sleep.  Postpartum depression requires treatment as it can last several months or longer if it is not treated. Treatment may include individual or group therapy, medicine, or both to address any social, physiological, and psychological factors that may play a role in the depression. Regular exercise, a healthy diet, rest, and social support may also be  strongly recommended.  Postpartum psychosis is more serious and needs treatment right away. Hospitalization is often needed. HOME CARE INSTRUCTIONS  Get as much rest as you can. Nap when the baby sleeps.  Exercise regularly. Some women find yoga and walking to be beneficial.  Eat a balanced and nourishing diet.  Do little things that you enjoy. Have a cup of tea, take a bubble bath, read your favorite magazine, or listen to your favorite music.  Avoid alcohol.  Ask for help with household chores, cooking, grocery shopping, or running errands as needed. Do not try to do everything.  Talk to people close to you about how you are feeling. Get support from your partner, family members, friends, or other new  moms.  Try to stay positive in how you think. Think about the things you are grateful for.  Do not spend a lot of time alone.  Only take medicine as directed by your caregiver.  Keep all your postpartum appointments.  Let your caregiver know if you have any concerns. SEEK MEDICAL CARE IF: You are having a reaction or problems with your medicine. SEEK IMMEDIATE MEDICAL CARE IF:  You have suicidal feelings.  You feel you may harm the baby or someone else. Document Released: 12/25/2003 Document Revised: 06/14/2011 Document Reviewed: 01/26/2011 [facility] Patient Information 2014 [place], [place].   Discharge to: home  Follow-up Information   Follow up with [facility] In 6 weeks. (As needed)    Contact information:   [address] [place] [address] [phone]       Nigel Bridgeman 06/03/2013

## 2013-06-03 NOTE — Lactation Note (Signed)
This note was copied from the chart of [name]. Lactation Consultation Note Follow up consult:  Mother states her nipples are sore, provided comfort gels and reviewed use.  Reviewed supply and demand and breastfeeding 8-12 times a day to maintain her milk supply.  Encouraged mother to call if assistance is needed.  Patient Name: Madeline Matthew SarasDeanne Kratt ZOXWR'UToday's Date: 06/03/2013 Reason for consult: Follow-up assessment   Maternal Data    Feeding    LATCH Score/Interventions                      Lactation Tools Discussed/Used Tools: Comfort gels   Consult Status Consult Status: Complete    Hardie PulleyBerkelhammer, Tully Mcinturff Boschen 06/03/2013, 10:40 AM

## 2013-11-05 ENCOUNTER — Other Ambulatory Visit (HOSPITAL_COMMUNITY)
Admission: RE | Admit: 2013-11-05 | Discharge: 2013-11-05 | Disposition: A | Discharge status: Home / Self Care | Payer: BC Managed Care – PPO | Source: Ambulatory Visit | Attending: Obstetrics and Gynecology | Admitting: Obstetrics and Gynecology

## 2013-11-05 ENCOUNTER — Other Ambulatory Visit: Payer: Self-pay | Admitting: Obstetrics and Gynecology

## 2013-11-05 DIAGNOSIS — Z1151 Encounter for screening for human papillomavirus (HPV): Secondary | ICD-10-CM | POA: Insufficient documentation

## 2013-11-05 DIAGNOSIS — Z01419 Encounter for gynecological examination (general) (routine) without abnormal findings: Secondary | ICD-10-CM | POA: Insufficient documentation

## 2013-11-06 LAB — CYTOLOGY - PAP

## 2014-02-04 ENCOUNTER — Encounter (HOSPITAL_COMMUNITY): Payer: Self-pay | Admitting: *Deleted

## 2017-02-08 ENCOUNTER — Other Ambulatory Visit: Payer: Self-pay | Admitting: Obstetrics and Gynecology

## 2017-02-08 ENCOUNTER — Other Ambulatory Visit (HOSPITAL_COMMUNITY)
Admission: RE | Admit: 2017-02-08 | Discharge: 2017-02-08 | Disposition: A | Discharge status: Home / Self Care | Payer: BC Managed Care – PPO | Source: Ambulatory Visit | Attending: Obstetrics and Gynecology | Admitting: Obstetrics and Gynecology

## 2017-02-08 DIAGNOSIS — Z124 Encounter for screening for malignant neoplasm of cervix: Secondary | ICD-10-CM | POA: Diagnosis present

## 2017-02-11 LAB — CYTOLOGY - PAP
Diagnosis: NEGATIVE
HPV (WINDOPATH): NOT DETECTED

## 2018-06-13 ENCOUNTER — Other Ambulatory Visit: Payer: Self-pay | Admitting: Obstetrics & Gynecology

## 2018-06-13 DIAGNOSIS — Z1231 Encounter for screening mammogram for malignant neoplasm of breast: Secondary | ICD-10-CM

## 2018-07-10 ENCOUNTER — Ambulatory Visit: Payer: BC Managed Care – PPO

## 2018-08-14 ENCOUNTER — Ambulatory Visit
Admission: RE | Admit: 2018-08-14 | Discharge: 2018-08-14 | Disposition: A | Discharge status: Home / Self Care | Payer: BC Managed Care – PPO | Source: Ambulatory Visit | Attending: Obstetrics & Gynecology | Admitting: Obstetrics & Gynecology

## 2018-08-14 ENCOUNTER — Other Ambulatory Visit: Payer: Self-pay

## 2018-08-14 DIAGNOSIS — Z1231 Encounter for screening mammogram for malignant neoplasm of breast: Secondary | ICD-10-CM

## 2018-08-17 ENCOUNTER — Other Ambulatory Visit: Payer: Self-pay | Admitting: Obstetrics & Gynecology

## 2018-08-17 DIAGNOSIS — R928 Other abnormal and inconclusive findings on diagnostic imaging of breast: Secondary | ICD-10-CM

## 2018-08-21 ENCOUNTER — Ambulatory Visit: Payer: BC Managed Care – PPO

## 2018-08-21 ENCOUNTER — Other Ambulatory Visit: Payer: BC Managed Care – PPO

## 2018-08-22 ENCOUNTER — Ambulatory Visit
Admission: RE | Admit: 2018-08-22 | Discharge: 2018-08-22 | Disposition: A | Discharge status: Home / Self Care | Payer: BC Managed Care – PPO | Source: Ambulatory Visit | Attending: Obstetrics & Gynecology | Admitting: Obstetrics & Gynecology

## 2018-08-22 ENCOUNTER — Other Ambulatory Visit: Payer: Self-pay

## 2018-08-22 DIAGNOSIS — R928 Other abnormal and inconclusive findings on diagnostic imaging of breast: Secondary | ICD-10-CM

## 2020-06-02 ENCOUNTER — Other Ambulatory Visit: Payer: Self-pay | Admitting: Obstetrics and Gynecology

## 2020-06-02 ENCOUNTER — Other Ambulatory Visit: Payer: Self-pay

## 2020-06-02 ENCOUNTER — Ambulatory Visit
Admission: RE | Admit: 2020-06-02 | Discharge: 2020-06-02 | Disposition: A | Discharge status: Home / Self Care | Payer: BC Managed Care – PPO | Source: Ambulatory Visit | Attending: Obstetrics and Gynecology | Admitting: Obstetrics and Gynecology

## 2020-06-02 DIAGNOSIS — Z1231 Encounter for screening mammogram for malignant neoplasm of breast: Secondary | ICD-10-CM

## 2021-06-30 IMAGING — MG MM DIGITAL SCREENING BILAT W/ TOMO AND CAD
6 of 10 series · 6 of 30 positions shown · non-contrast
Comparison: Previous exam(s).

CLINICAL DATA: Screening.

EXAM:
DIGITAL SCREENING BILATERAL MAMMOGRAM WITH TOMOSYNTHESIS AND CAD
TECHNIQUE: Bilateral screening digital craniocaudal and mediolateral oblique
mammograms were obtained. Bilateral screening digital breast
tomosynthesis was performed. The images were evaluated with
computer-aided detection.

[L CC synth-2D]
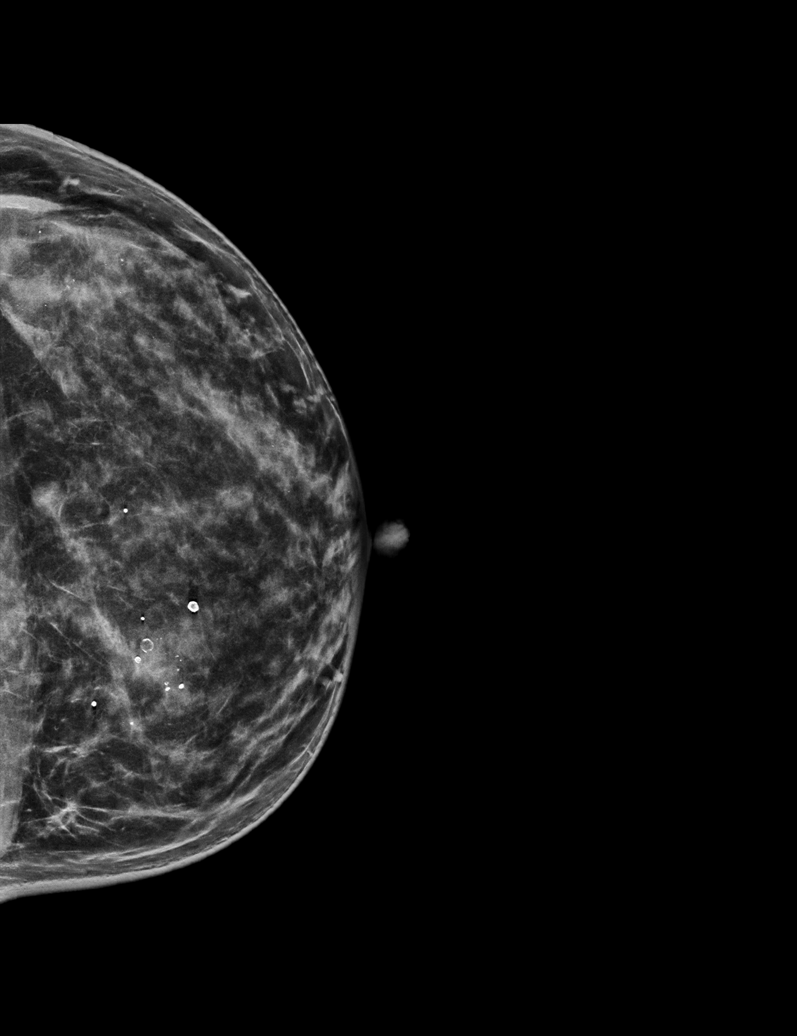

[L MLO synth-2D]
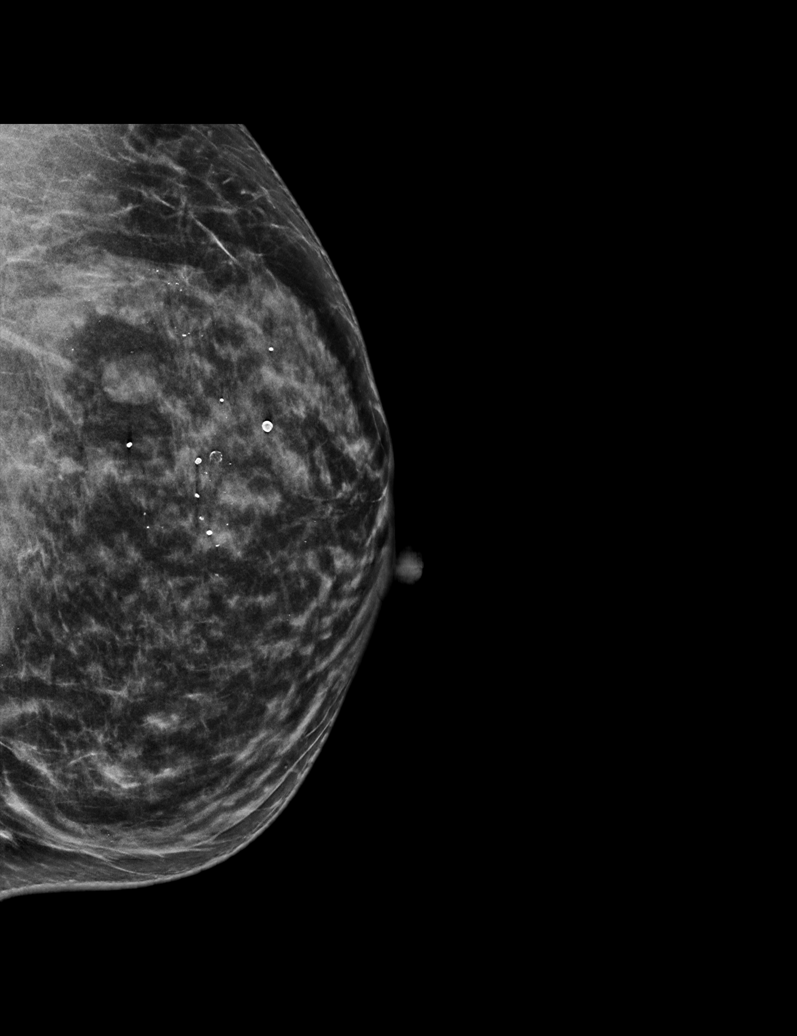

[R CC synth-2D (1 of 2)]
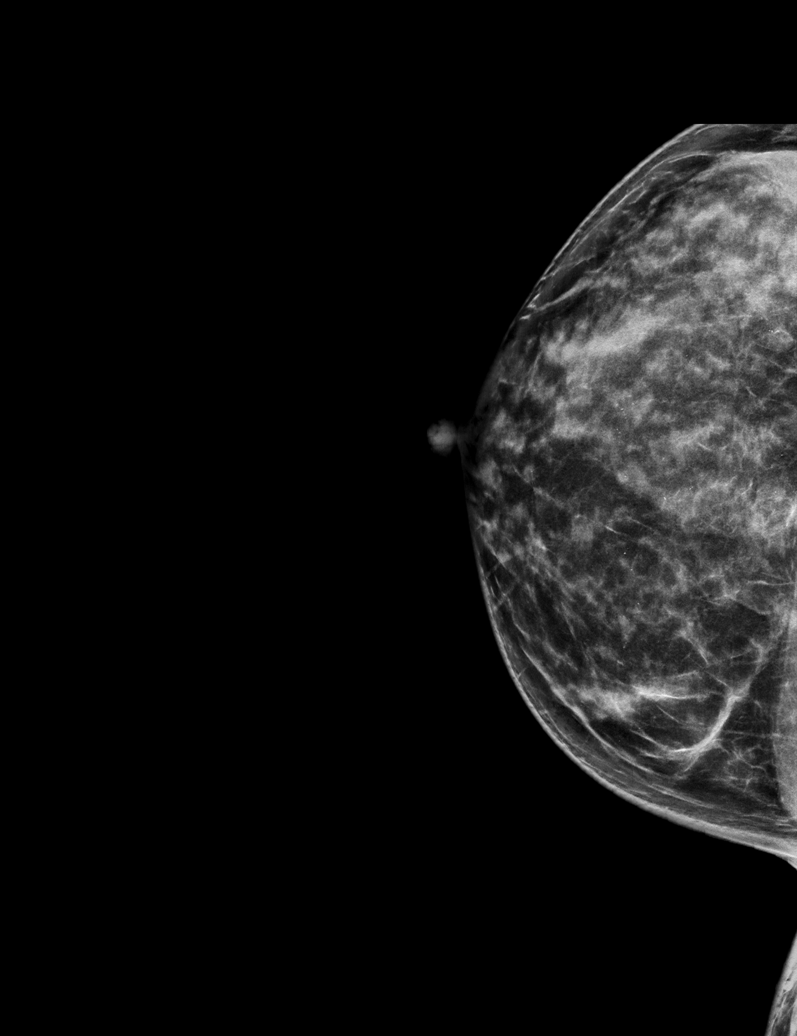

[R MLO synth-2D]
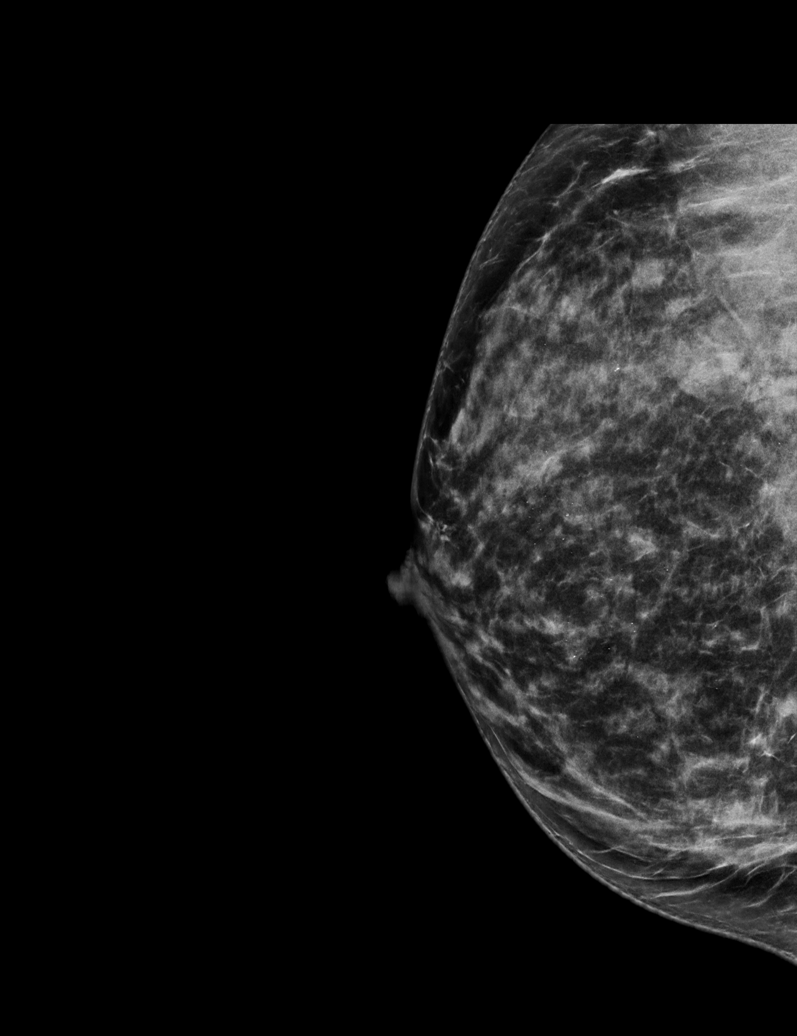

[R CC synth-2D (2 of 2)]
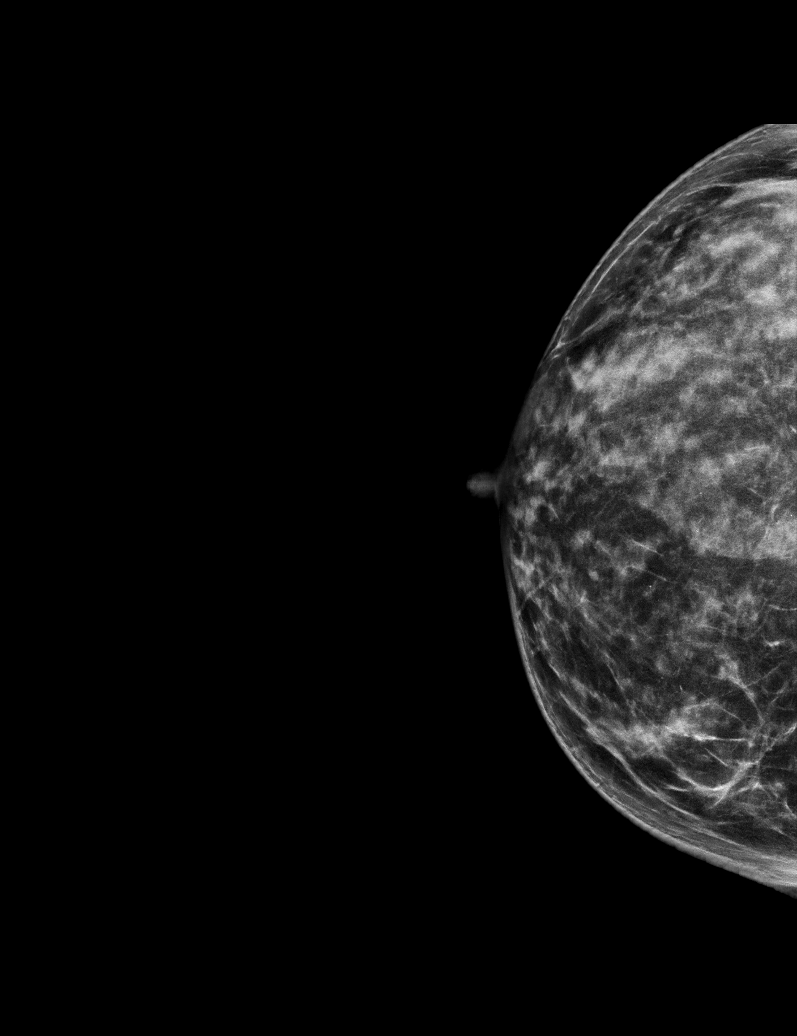

[R MLO tomo · tomo slice 30/59.0]
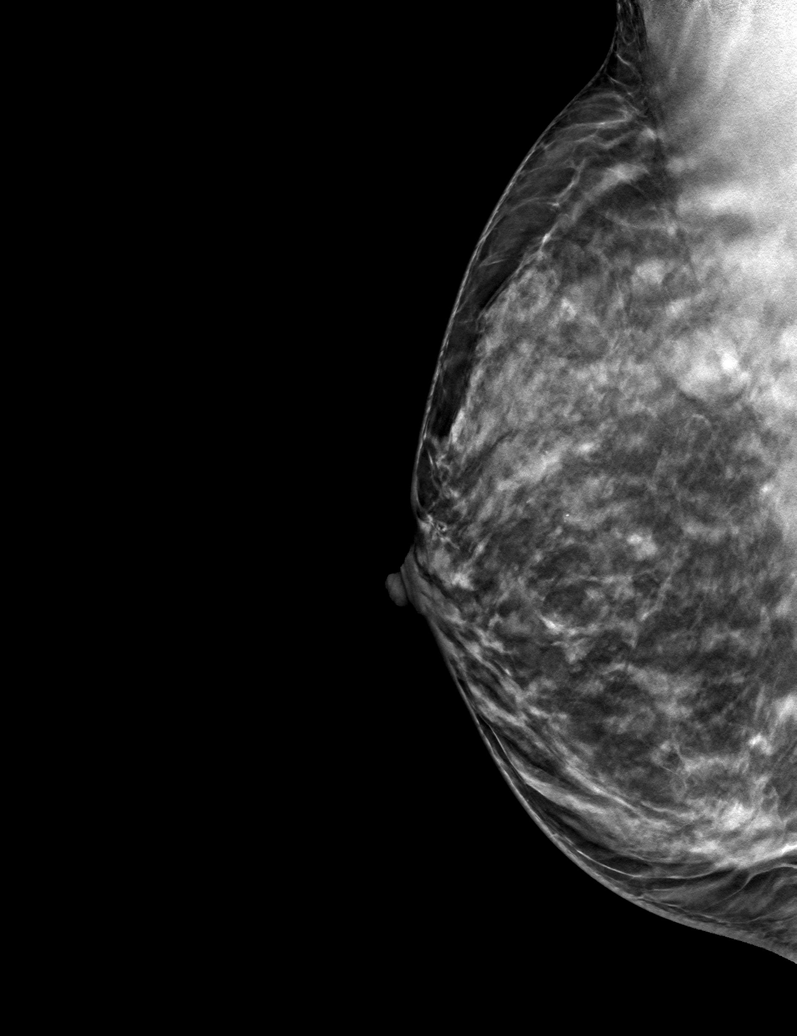

[6 of 30 positions shown; findings below may reference images not displayed]

ACR Breast Density Category c: The breast tissue is heterogeneously
dense, which may obscure small masses.
FINDINGS: There are no findings suspicious for malignancy.

Multiple waxing and waning circumscribed oval masses within both
breasts are considered benign.

The images were evaluated with computer-aided detection.
IMPRESSION: No mammographic evidence of malignancy. A result letter of this
screening mammogram will be mailed directly to the patient.

RECOMMENDATION:
Screening mammogram in one year. (Code:[id number])

BI-RADS CATEGORY  2: Benign.

## 2022-02-08 ENCOUNTER — Other Ambulatory Visit: Payer: Self-pay | Admitting: Nurse Practitioner

## 2022-02-08 ENCOUNTER — Other Ambulatory Visit (HOSPITAL_COMMUNITY)
Admission: RE | Admit: 2022-02-08 | Discharge: 2022-02-08 | Disposition: A | Discharge status: Home / Self Care | Payer: BC Managed Care – PPO | Source: Ambulatory Visit | Attending: Nurse Practitioner | Admitting: Nurse Practitioner

## 2022-02-08 DIAGNOSIS — R87810 Cervical high risk human papillomavirus (HPV) DNA test positive: Secondary | ICD-10-CM | POA: Insufficient documentation

## 2022-02-10 LAB — CYTOLOGY - PAP
Comment: NEGATIVE
Diagnosis: UNDETERMINED — AB
High risk HPV: NEGATIVE

## 2022-02-22 ENCOUNTER — Other Ambulatory Visit: Payer: Self-pay | Admitting: Obstetrics and Gynecology

## 2022-02-22 DIAGNOSIS — Z1231 Encounter for screening mammogram for malignant neoplasm of breast: Secondary | ICD-10-CM

## 2022-04-16 ENCOUNTER — Ambulatory Visit
Admission: RE | Admit: 2022-04-16 | Discharge: 2022-04-16 | Disposition: A | Discharge status: Home / Self Care | Payer: BC Managed Care – PPO | Source: Ambulatory Visit | Attending: Obstetrics and Gynecology | Admitting: Obstetrics and Gynecology

## 2022-04-16 DIAGNOSIS — Z1231 Encounter for screening mammogram for malignant neoplasm of breast: Secondary | ICD-10-CM

## 2023-02-16 ENCOUNTER — Other Ambulatory Visit: Payer: Self-pay | Admitting: Nurse Practitioner

## 2023-02-16 ENCOUNTER — Other Ambulatory Visit (HOSPITAL_COMMUNITY)
Admission: RE | Admit: 2023-02-16 | Discharge: 2023-02-16 | Disposition: A | Discharge status: Home / Self Care | Payer: BC Managed Care – PPO | Source: Ambulatory Visit | Attending: Nurse Practitioner | Admitting: Nurse Practitioner

## 2023-02-16 DIAGNOSIS — Z124 Encounter for screening for malignant neoplasm of cervix: Secondary | ICD-10-CM | POA: Insufficient documentation

## 2023-02-21 LAB — CYTOLOGY - PAP
Comment: NEGATIVE
Diagnosis: NEGATIVE
Diagnosis: REACTIVE
High risk HPV: NEGATIVE

## 2023-03-10 ENCOUNTER — Encounter: Payer: Self-pay | Admitting: Orthopedic Surgery

## 2023-10-19 ENCOUNTER — Other Ambulatory Visit: Payer: Self-pay | Admitting: Obstetrics and Gynecology

## 2023-10-19 DIAGNOSIS — Z1231 Encounter for screening mammogram for malignant neoplasm of breast: Secondary | ICD-10-CM

## 2023-11-02 ENCOUNTER — Ambulatory Visit
Admission: RE | Admit: 2023-11-02 | Discharge: 2023-11-02 | Disposition: A | Discharge status: Home / Self Care | Payer: Self-pay | Source: Ambulatory Visit | Attending: Obstetrics and Gynecology | Admitting: Obstetrics and Gynecology

## 2023-11-02 DIAGNOSIS — Z1231 Encounter for screening mammogram for malignant neoplasm of breast: Secondary | ICD-10-CM
